# Patient Record
Sex: Male | Born: 2000 | Race: Black or African American | Hispanic: No | Marital: Single | Smoking: Never smoker
Health system: Southern US, Community
[De-identification: ages and names within clinical notes are randomized; demographics above are authoritative.]

---
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
## 2019-09-24 ENCOUNTER — Ambulatory Visit: Payer: Self-pay | Attending: Internal Medicine

## 2019-09-24 DIAGNOSIS — Z23 Encounter for immunization: Secondary | ICD-10-CM

## 2019-09-24 NOTE — Progress Notes (Signed)
   Covid-19 Vaccination Clinic  Name:  Logan Silva    MRN: [id number] DOB: May 29, 2001  09/24/2019  [name] was observed post Covid-19 immunization for 15 minutes without incident. He was provided with Vaccine Information Sheet and instruction to access the V-Safe system.   [name] was instructed to call 911 with any severe reactions post vaccine: Marland Kitchen Difficulty breathing  . Swelling of face and throat  . A fast heartbeat  . A bad rash all over body  . Dizziness and weakness   Immunizations Administered    Name Date Dose VIS Date Route   Moderna COVID-19 Vaccine 09/24/2019 12:15 PM 0.5 mL 05/14/2019 Intramuscular   Manufacturer: Moderna   Lot: [id number]   NDC: 30092-330-07

## 2019-10-29 ENCOUNTER — Ambulatory Visit: Payer: Self-pay | Attending: Internal Medicine

## 2019-10-29 ENCOUNTER — Ambulatory Visit: Payer: Self-pay

## 2019-10-29 DIAGNOSIS — Z23 Encounter for immunization: Secondary | ICD-10-CM

## 2019-10-29 NOTE — Progress Notes (Signed)
   Covid-19 Vaccination Clinic  Name:  Logan Silva    MRN: [id number] DOB: October 05, 2000  10/29/2019  [name] was observed post Covid-19 immunization for 15 minutes without incident. He was provided with Vaccine Information Sheet and instruction to access the V-Safe system.   [name] was instructed to call 911 with any severe reactions post vaccine: Marland Kitchen Difficulty breathing  . Swelling of face and throat  . A fast heartbeat  . A bad rash all over body  . Dizziness and weakness   Immunizations Administered    Name Date Dose VIS Date Route   Moderna COVID-19 Vaccine 10/29/2019  9:10 AM 0.5 mL 05/2019 Intramuscular   Manufacturer: Moderna   Lot: [id number]   NDC: 92230-097-94

## 2019-10-30 ENCOUNTER — Ambulatory Visit: Payer: Self-pay
